# Patient Record
Sex: Female | Born: 1975 | Race: White | Hispanic: No | Marital: Married | Smoking: Never smoker
Health system: Southern US, Community
[De-identification: ages and names within clinical notes are randomized; demographics above are authoritative.]

---

## 1999-02-10 ENCOUNTER — Other Ambulatory Visit: Admission: RE | Admit: 1999-02-10 | Discharge: 1999-02-10 | Payer: Self-pay | Admitting: Obstetrics and Gynecology

## 2000-12-15 ENCOUNTER — Emergency Department (HOSPITAL_COMMUNITY): Admission: EM | Admit: 2000-12-15 | Discharge: 2000-12-15 | Payer: Self-pay | Admitting: Emergency Medicine

## 2000-12-18 ENCOUNTER — Ambulatory Visit (HOSPITAL_BASED_OUTPATIENT_CLINIC_OR_DEPARTMENT_OTHER): Admission: RE | Admit: 2000-12-18 | Discharge: 2000-12-18 | Payer: Self-pay | Admitting: *Deleted

## 2002-08-13 ENCOUNTER — Other Ambulatory Visit: Admission: RE | Admit: 2002-08-13 | Discharge: 2002-08-13 | Payer: Self-pay | Admitting: Obstetrics and Gynecology

## 2003-08-21 ENCOUNTER — Other Ambulatory Visit: Admission: RE | Admit: 2003-08-21 | Discharge: 2003-08-21 | Payer: Self-pay | Admitting: Obstetrics and Gynecology

## 2004-03-04 ENCOUNTER — Ambulatory Visit (HOSPITAL_COMMUNITY): Admission: RE | Admit: 2004-03-04 | Discharge: 2004-03-04 | Payer: Self-pay | Admitting: Obstetrics and Gynecology

## 2004-03-04 ENCOUNTER — Encounter (INDEPENDENT_AMBULATORY_CARE_PROVIDER_SITE_OTHER): Payer: Self-pay | Admitting: *Deleted

## 2005-02-19 ENCOUNTER — Inpatient Hospital Stay (HOSPITAL_COMMUNITY): Admission: AD | Admit: 2005-02-19 | Discharge: 2005-02-21 | Payer: Self-pay | Admitting: Obstetrics and Gynecology

## 2006-07-26 ENCOUNTER — Inpatient Hospital Stay (HOSPITAL_COMMUNITY): Admission: AD | Admit: 2006-07-26 | Discharge: 2006-07-26 | Payer: Self-pay | Admitting: Obstetrics and Gynecology

## 2006-09-06 ENCOUNTER — Inpatient Hospital Stay (HOSPITAL_COMMUNITY): Admission: AD | Admit: 2006-09-06 | Discharge: 2006-09-08 | Payer: Self-pay | Admitting: *Deleted

## 2008-12-04 ENCOUNTER — Inpatient Hospital Stay (HOSPITAL_COMMUNITY): Admission: AD | Admit: 2008-12-04 | Discharge: 2008-12-06 | Payer: Self-pay | Admitting: Obstetrics and Gynecology

## 2010-11-09 LAB — CBC
HCT: 34.9 % — ABNORMAL LOW (ref 36.0–46.0)
Hemoglobin: 12.1 g/dL (ref 12.0–15.0)
MCHC: 34.7 g/dL (ref 30.0–36.0)
MCHC: 34.8 g/dL (ref 30.0–36.0)
MCV: 99.1 fL (ref 78.0–100.0)
Platelets: 102 10*3/uL — ABNORMAL LOW (ref 150–400)
Platelets: 93 10*3/uL — ABNORMAL LOW (ref 150–400)
RDW: 14.7 % (ref 11.5–15.5)
WBC: 8 10*3/uL (ref 4.0–10.5)

## 2010-12-14 NOTE — H&P (Signed)
NAMEJOVAN, SCHICKLING             ACCOUNT NO.:  [id number]   MEDICAL RECORD NO.:  [id number]          PATIENT TYPE:  INP   LOCATION:  9166                          FACILITY:  [facility]   PHYSICIAN:  Lenoard Aden, M.D.DATE OF BIRTH:  Jul 26, 1976   DATE OF ADMISSION:  12/04/2008  DATE OF DISCHARGE:                              HISTORY & PHYSICAL   CHIEF COMPLAINT:  Labor.   HISTORY:  She is a 35 year old white female G3, P2 at 78 plus weeks'  gestation with a history of probable large for gestational age fetus,  who presents in active labor with contractions every 3-4 minutes.   ALLERGIES:  She has no known drug allergies.   MEDICATIONS:  Prenatal vitamins.   PAST HISTORY:  She has a previous history of being GBS positive,  currently negative at this pregnancy.   SOCIAL HISTORY:  She is a nonsmoker, nondrinker.  Denies domestic  physical violence.   FAMILY HISTORY:  Kidney disease, skin and breast cancer, thyroid  disease, and rheumatoid arthritis.   PREVIOUS PREGNANCY HISTORY:  Remarkable for 2 miscarriages, 1 with D and  C and 1 uncomplicated vaginal delivery of a 9 pounds 9 ounces fetus and  other uncomplicated vaginal delivery of a 8 pounds 15 ounces fetus.   PRENATAL COURSE:  Otherwise to date uncomplicated except for large for  gestational age fetus.   PHYSICAL EXAMINATION:  GENERAL:  She is a well-developed and well-  nourished white female in mild amount of distress.  HEENT:  Normal.  LUNGS:  Clear.  HEART:  Regular rhythm.  ABDOMEN:  Soft, gravid, and nontender.  Estimated fetal weight is 9  pounds.  Cervix is 4, 100% vertex, 0 station.  EXTREMITIES:  No cords.  NEUROLOGIC:  Nonfocal.  SKIN:  Intact.   IMPRESSION:  A 39-week intrauterine pregnancy in active labor.   PLAN:  Epidural Pitocin as needed.  Anticipate attempts at vaginal  delivery.      Lenoard Aden, M.D.  Electronically Signed     RJT/MEDQ  D:  12/04/2008  T:  12/04/2008  Job:   [id number]

## 2010-12-17 NOTE — Op Note (Signed)
[facility]  Patient:    Tamara Li, Tamara Li                        MRN: [id number] Proc. Date: 12/18/00 Adm. Date:  [id number] Attending:  Kendell Bane                           Operative Report  PREOPERATIVE DIAGNOSIS:  Partial flexor tendon laceration and radial digital nerve laceration, right index finger.  POSTOPERATIVE DIAGNOSIS:  Partial flexor tendon laceration and radial digital nerve laceration, right index finger.  PROCEDURE:  Repair of flexor sheath and radial digital nerve, right index finger.  SURGEON:  Lowell Bouton, M.D.  ANESTHESIA:  General.  OPERATIVE FINDINGS:  The patient had a 30% laceration of the profundus tendon beneath the A4 pulley on the right index finger.  There was a transverse laceration in the A4 pulley and the partial tendon laceration was triggering on the pulley.  The radial digital nerve and artery were lacerated at the same level as the tendon.  DESCRIPTION OF PROCEDURE:  Under general anesthesia with a tourniquet on the right arm, the right hand was prepped and draped in the usual fashion.  After elevating the limb, the tourniquet was inflated to 250 mmHg.  The previous sutures were removed and the laceration was extended proximally in a zigzag fashion.  Blunt dissection was carried down to the flexor sheath and the opening in the A4 pulley was examined.  The profundus tendon was seen beneath the pulley and was found to be approximately 30% transected.  It was felt that repairing the pulley would allow for good gliding of the tendon without a need to debride it or repair it.  A 6-0 Prolene was used to repair the A4 pulley and there was no triggering of the tendon beneath it.  The microscope was then brought in and the ends of the digital nerve were identified and trimmed back with the scissors.  A 9-0 nylon suture was used to repair the radial digital nerve using an epineural  repair.  The wound was then irrigated copiously with saline and the skin was closed with 4-0 nylon sutures.  Sterile dressings were applied followed by a dorsal protective splint.  The tourniquet was released with good circulation of the digits.  The patient went to the recovery room awake, stable, and in good condition. DD:  12/18/00 TD:  12/19/00 Job: [id number]

## 2010-12-17 NOTE — Op Note (Signed)
Tamara Li, Tamara Li                         ACCOUNT NO.:  [id number]   MEDICAL RECORD NO.:  [id number]                   PATIENT TYPE:  AMB   LOCATION:  SDC                                  [facility]ACILITY:  [facility]   PHYSICIAN:  Richardean Sale, M.D.                DATE OF BIRTH:  [birth year]   DATE OF PROCEDURE:  03/04/2004  DATE OF DISCHARGE:                                 OPERATIVE REPORT   PREOPERATIVE DIAGNOSIS:  Missed abortion.   POSTOPERATIVE DIAGNOSIS:  Missed abortion.   OPERATION PERFORMED:  Dilation and curettage with suction.   SURGEON:  Richardean Sale, M.D.   ANESTHESIA:  Conscious sedation with paracervical block.   ESTIMATED BLOOD LOSS:  Minimal.   COMPLICATIONS:  None.   FINDINGS:  Small retroverted uterus, nontender, no palpable masses.  Adnexa  normal.  Moderate amount of products of conception.   SPECIMENS:  Products of conception.   INDICATIONS FOR PROCEDURE:  The patient is a 35 year old gravida 1, para 0  white female who presented for obstetric care in the first trimester.  Once  week prior to the day of procedure, she was found to have an intrauterine  pregnancy with a bradycardic rhythm.  Follow-up ultrasound one week later  revealed an intrauterine fetal pole with an absent cardiac rhythm consistent  with missed abortion.  The patient presents today for surgical management  with  D&C with suction.  Prior to the procedure, the risks were reviewed  with the patient and her husband.  Specifically, we discussed the risks of  hemorrhage requiring transfusion, uterine perforation which could result in  injury to the bowel or bladder or other organs which would require  additional surgery, entry through the abdomen either by laparoscopy or  laparotomy with repair of any injury, intrauterine scarring which could  cause difficulty getting pregnant in the future, postoperative infection  which could require hospitalization with antibiotics, possibility of  retained products which would require repeat procedure in the future,  anesthetic-related complications.  The patient was understanding of all  these risks and agreed to proceed.  Informed consent was obtained and all  questions were answered prior to proceeding to the operating room.   DESCRIPTION OF PROCEDURE:  The patient was taken to the operating room where  she was given intravenous sedation.  She was then prepped and draped in the  usual sterile fashion and placed in the dorsal lithotomy position. Her  bladder had been emptied just prior to entering the operating room.  Bimanual exam was performed which confirmed the presence of a small  retroverted uterus, approximately 8 weeks size, no adnexal masses were  noted.  A speculum was then placed in the vagina.  The cervix was easily  visualized and was injected at the 12 o'clock position with 2 mL of 1%  Nesacaine. A single toothed tenaculum was used to grasp the anterior lip of  the cervix.  A paracervical block was then administered using a total of 20  mL of 1% Nesacaine.  The uterus was then sounded to 9 cm.  The uterus was  then very carefully dilated with Hegar dilators.  A #8 suction curet was  introduced and suction was applied.  Moderate amounts of products of  conception were removed.  Suction was then discontinued.  This was followed  by a sharp curettage.  Suction was then applied one more time to remove any  loose tissue in the uterine cavity.  Curettage was repeated until a gritty  texture was noted on all four sides of the uterine cavity.  The specimen was  then sent to pathology labeled products of conception.  Once curettage and  suction were complete, the single toothed tenaculum was removed.  The  tenaculum site was then cauterized with silver nitrate.  There was minimal  bleeding noted from the cervix.  The speculum was then removed.  Bimanual  exam confirmed the presence of a small retroverted uterus and no  palpable  masses.  There were no complications, no evidence of uterine perforation.  All sponge, lap, needle and instrument counts were correct times two.  The  patient was taken out of dorsal lithotomy position.  Her sedation was  reversed and she was taken to recovery room awake and in stable condition.                                               Richardean Sale, M.D.    JW/MEDQ  D:  03/04/2004  T:  03/05/2004  Job:  [id number]

## 2014-07-31 ENCOUNTER — Other Ambulatory Visit: Payer: Self-pay | Admitting: Dermatology

## 2015-01-13 ENCOUNTER — Other Ambulatory Visit: Payer: Self-pay | Admitting: Orthopedic Surgery

## 2015-01-13 DIAGNOSIS — R52 Pain, unspecified: Secondary | ICD-10-CM

## 2015-01-15 ENCOUNTER — Ambulatory Visit
Admission: RE | Admit: 2015-01-15 | Discharge: 2015-01-15 | Disposition: A | Discharge status: Home / Self Care | Payer: 59 | Source: Ambulatory Visit | Attending: Orthopedic Surgery | Admitting: Orthopedic Surgery

## 2015-01-15 DIAGNOSIS — R52 Pain, unspecified: Secondary | ICD-10-CM

## 2015-03-22 ENCOUNTER — Ambulatory Visit (INDEPENDENT_AMBULATORY_CARE_PROVIDER_SITE_OTHER): Payer: 59

## 2015-03-22 ENCOUNTER — Ambulatory Visit (INDEPENDENT_AMBULATORY_CARE_PROVIDER_SITE_OTHER): Payer: 59 | Admitting: Internal Medicine

## 2015-03-22 VITALS — BP 100/66 | HR 93 | Temp 98.3°F | Resp 18 | Ht 65.0 in | Wt 126.0 lb

## 2015-03-22 DIAGNOSIS — B9689 Other specified bacterial agents as the cause of diseases classified elsewhere: Secondary | ICD-10-CM

## 2015-03-22 DIAGNOSIS — R05 Cough: Secondary | ICD-10-CM

## 2015-03-22 DIAGNOSIS — R059 Cough, unspecified: Secondary | ICD-10-CM

## 2015-03-22 DIAGNOSIS — J019 Acute sinusitis, unspecified: Secondary | ICD-10-CM | POA: Diagnosis not present

## 2015-03-22 LAB — POCT CBC
GRANULOCYTE PERCENT: 76.6 % (ref 37–80)
HEMATOCRIT: 41 % (ref 37.7–47.9)
Hemoglobin: 13.3 g/dL (ref 12.2–16.2)
LYMPH, POC: 1.7 (ref 0.6–3.4)
MCH, POC: 30.8 pg (ref 27–31.2)
MCHC: 32.5 g/dL (ref 31.8–35.4)
MCV: 94.6 fL (ref 80–97)
MID (CBC): 1.1 — AB (ref 0–0.9)
MPV: 8.2 fL (ref 0–99.8)
PLATELET COUNT, POC: 257 10*3/uL (ref 142–424)
POC Granulocyte: 9.1 — AB (ref 2–6.9)
POC LYMPH %: 14 % (ref 10–50)
POC MID %: 9.4 %M (ref 0–12)
RBC: 4.33 M/uL (ref 4.04–5.48)
RDW, POC: 13.9 %
WBC: 11.9 10*3/uL — AB (ref 4.6–10.2)

## 2015-03-22 MED ORDER — PREDNISONE 20 MG PO TABS: 40.0000 mg | ORAL_TABLET | Freq: Every day | ORAL | Status: DC

## 2015-03-22 MED ORDER — AMOXICILLIN-POT CLAVULANATE 875-125 MG PO TABS: 1.0000 | ORAL_TABLET | Freq: Two times a day (BID) | ORAL | Status: DC

## 2015-03-22 NOTE — Progress Notes (Signed)
03/22/2015 at 4:53 PM  Tamara Li / DOB: 01/07/1976 / MRN: [id number]  The patient  does not have a problem list on file.  SUBJECTIVE  Tamara Li is a 39 y.o. female complaining of dry cough, nasal blockage, post nasal drip and sinus and nasal congestion that started 4 weeks ago.  Associated symptoms include headache today, and she denies difficulty breathing and jaw pain.The patient symptoms are worsening. Treatments tried thus far include acetaminophen, ibuprofen, antihistamine-decongestant of choice with fair  relief. She reports sick contacts.  She  has no past medical history on file.    Medications reviewed and updated by myself where necessary, and exist elsewhere in the encounter.   Ms. Credeur has No Known Allergies. She  reports that she has never smoked. She does not have any smokeless tobacco history on file. She reports that she drinks about 0.6 - 1.2 oz of alcohol per week. She reports that she does not use illicit drugs. She  has no sexual activity history on file. The patient  has no past surgical history on file.  Her family history includes Hyperlipidemia in her father.  Review of Systems  Constitutional: Negative for fever and chills.  Respiratory: Negative for shortness of breath.   Cardiovascular: Negative for chest pain.  Gastrointestinal: Negative for nausea and abdominal pain.  Genitourinary: Negative.   Skin: Negative for rash.  Neurological: Negative for dizziness and headaches.    OBJECTIVE  Her  height is 5\' 5"  (1.651 m) and weight is 126 lb (57.153 kg). Her oral temperature is 98.3 F (36.8 C). Her blood pressure is 100/66 and her pulse is 93. Her respiration is 18 and oxygen saturation is 98%.  The patient's body mass index is 20.97 kg/(m^2).  Physical Exam  Constitutional: She is oriented to person, place, and time. She appears well-developed and well-nourished. No distress.  HENT:  Right Ear: Hearing, tympanic membrane, external ear and  ear canal normal.  Left Ear: Hearing, tympanic membrane, external ear and ear canal normal.  Nose: Mucosal edema present. Right sinus exhibits no maxillary sinus tenderness and no frontal sinus tenderness. Left sinus exhibits no maxillary sinus tenderness and no frontal sinus tenderness.  Mouth/Throat: Uvula is midline, oropharynx is clear and moist and mucous membranes are normal.  Cardiovascular: Normal rate, regular rhythm and normal heart sounds.   Respiratory: Effort normal and breath sounds normal. She has no wheezes. She has no rales.  Neurological: She is alert and oriented to person, place, and time.  Skin: Skin is warm and dry. She is not diaphoretic.  Psychiatric: She has a normal mood and affect.    Results for orders placed or performed in visit on 03/22/15 (from the past 24 hour(s))  POCT CBC     Status: Abnormal   Collection Time: 03/22/15  3:18 PM  Result Value Ref Range   WBC 11.9 (A) 4.6 - 10.2 K/uL   Lymph, poc 1.7 0.6 - 3.4   POC LYMPH PERCENT 14.0 10 - 50 %L   MID (cbc) 1.1 (A) 0 - 0.9   POC MID % 9.4 0 - 12 %M   POC Granulocyte 9.1 (A) 2 - 6.9   Granulocyte percent 76.6 37 - 80 %G   RBC 4.33 4.04 - 5.48 M/uL   Hemoglobin 13.3 12.2 - 16.2 g/dL   HCT, POC 40.9 81.1 - 47.9 %   MCV 94.6 80 - 97 fL   MCH, POC 30.8 27 - 31.2 pg  MCHC 32.5 31.8 - 35.4 g/dL   RDW, POC 16.1 %   Platelet Count, POC 257 142 - 424 K/uL   MPV 8.2 0 - 99.8 fL   [facility] reading (PRIMARY) by  Dr. Merla Riches: Lungs positive for hyperexpansion. Negative for acute cardiopulmonary disease.   ASSESSMENT & PLAN  Tamara Li was seen today for cough and sinus pressure.  Diagnoses and all orders for this visit:  Cough -     POCT CBC -     DG Chest 2 View; Future -     predniSONE (DELTASONE) 20 MG tablet; Take 2 tablets (40 mg total) by mouth daily with breakfast.  Acute bacterial rhinosinusitis: Patient with double sickening. Will cover for bacterial infection.  -     amoxicillin-clavulanate  (AUGMENTIN) 875-125 MG per tablet; Take 1 tablet by mouth 2 (two) times daily.    The patient was advised to call or come back to clinic if she does not see an improvement in symptoms, or worsens with the above plan.   Deliah Boston, MHS, PA-C [facility] 03/22/2015 4:53 PM I have participated in the care of this patient with the Advanced Practice Provider and agree with Diagnosis and Plan as documented. Robert P. Merla Riches, M.D.

## 2016-01-06 ENCOUNTER — Other Ambulatory Visit: Payer: Self-pay | Admitting: Orthopedic Surgery

## 2016-01-06 DIAGNOSIS — M545 Low back pain: Secondary | ICD-10-CM

## 2016-01-13 ENCOUNTER — Ambulatory Visit
Admission: RE | Admit: 2016-01-13 | Discharge: 2016-01-13 | Disposition: A | Discharge status: Home / Self Care | Payer: 59 | Source: Ambulatory Visit | Attending: Orthopedic Surgery | Admitting: Orthopedic Surgery

## 2016-01-13 DIAGNOSIS — M545 Low back pain: Secondary | ICD-10-CM

## 2016-04-08 ENCOUNTER — Encounter: Payer: Self-pay | Admitting: Physician Assistant

## 2016-04-08 ENCOUNTER — Ambulatory Visit (INDEPENDENT_AMBULATORY_CARE_PROVIDER_SITE_OTHER): Payer: 59 | Admitting: Physician Assistant

## 2016-04-08 VITALS — BP 114/72 | HR 98 | Temp 98.6°F | Resp 16 | Ht 65.0 in | Wt 129.0 lb

## 2016-04-08 DIAGNOSIS — J069 Acute upper respiratory infection, unspecified: Secondary | ICD-10-CM

## 2016-04-08 DIAGNOSIS — R070 Pain in throat: Secondary | ICD-10-CM

## 2016-04-08 DIAGNOSIS — B9789 Other viral agents as the cause of diseases classified elsewhere: Principal | ICD-10-CM

## 2016-04-08 LAB — POCT RAPID STREP A (OFFICE): Rapid Strep A Screen: NEGATIVE

## 2016-04-08 MED ORDER — BENZONATATE 100 MG PO CAPS: 100.0000 mg | ORAL_CAPSULE | Freq: Three times a day (TID) | ORAL | 0 refills | Status: AC | PRN

## 2016-04-08 MED ORDER — HYDROCOD POLST-CPM POLST ER 10-8 MG/5ML PO SUER: 5.0000 mL | Freq: Every evening | ORAL | 0 refills | Status: AC | PRN

## 2016-04-08 MED ORDER — IPRATROPIUM BROMIDE 0.03 % NA SOLN: 2.0000 | Freq: Two times a day (BID) | NASAL | 0 refills | Status: AC

## 2016-04-08 MED ORDER — GUAIFENESIN ER 1200 MG PO TB12
1.0000 | ORAL_TABLET | Freq: Two times a day (BID) | ORAL | 1 refills | Status: AC | PRN
Start: 2016-04-08 — End: ?

## 2016-04-08 NOTE — Progress Notes (Signed)
Patient ID: Tamara Li, female   DOB: 1976/07/17, 40 y.o.   MRN: [id number] [facility] [address], [place] [address] [phone]  By signing my name below, I, Essence Howell, attest that this documentation has been prepared under the direction and in the presence of Trena PlattStephanie English, PA-C Electronically Signed: Charline BillsEssence Howell, ED Scribe 04/08/2016 at 3:04 PM.  Date:  04/08/2016   Name:  Tamara Li   DOB:  1976/07/17   MRN:  [id number]  PCP:  Martha ClanShaw, William, MD   History of Present Illness:  Tamara Li is a 40 y.o. female patient [facility] of nasal congestion for the past 10 days. Pt reports associated symptoms of faint dry cough, sore throat x 3 days that is exacerbated with swallowing, scratchy throat, watery eyes, clear rhinorrhea, left ear pain, fatigue. She denies fever, sneezing, sob, trouble breathing nausea. Pt states that her children are sick with similar symptoms. She has tried TheraFlu and OTC cough and cold medications without relief and keeps her up at night.   There are no active problems to display for this patient.   History reviewed. No pertinent past medical history.  History reviewed. No pertinent surgical history.  Social History  Substance Use Topics   Smoking status: Never Smoker   Smokeless tobacco: Never Used   Alcohol use 0.6 - 1.2 oz/week    1 - 2 Standard drinks or equivalent per week    Family History  Problem Relation Age of Onset   Hyperlipidemia Father    Stroke Maternal Grandfather     No Known Allergies  Medication list has been reviewed and updated.  No current outpatient prescriptions on file prior to visit.   No current facility-administered medications on file prior to visit.     Review of Systems  Constitutional: Positive for malaise/fatigue. Negative for fever.  HENT: Positive for congestion, ear pain and sore throat.   Respiratory: Positive for cough.  Negative for shortness of breath.   Gastrointestinal: Negative for nausea.    Physical Examination: BP 114/72    Pulse 98    Temp 98.6 F (37 C) (Oral)    Resp 16    Ht 5\' 5"  (1.651 m)    Wt 129 lb (58.5 kg)    SpO2 98%    BMI 21.47 kg/m  Ideal Body Weight: @FLOWAMB ([phone])@[phone])@  Physical Exam  Constitutional: She is oriented to person, place, and time. She appears well-developed and well-nourished. No distress.  HENT:  Head: Normocephalic and atraumatic.  Right Ear: Tympanic membrane, external ear and ear canal normal.  Left Ear: Tympanic membrane, external ear and ear canal normal.  Nose: Mucosal edema and rhinorrhea present. Right sinus exhibits no maxillary sinus tenderness and no frontal sinus tenderness. Left sinus exhibits no maxillary sinus tenderness and no frontal sinus tenderness.  Mouth/Throat: No uvula swelling. No oropharyngeal exudate, posterior oropharyngeal edema or posterior oropharyngeal erythema.  No tonsillar edema but there is redness  Eyes: Conjunctivae and EOM are normal. Pupils are equal, round, and reactive to light.  Cardiovascular: Normal rate and regular rhythm.  Exam reveals no gallop, no distant heart sounds and no friction rub.   No murmur heard. Pulmonary/Chest: Effort normal. No respiratory distress. She has no decreased breath sounds. She has no wheezes. She has no rhonchi.  Lymphadenopathy:       Head (right side): No submandibular, no tonsillar, no preauricular and no posterior auricular adenopathy present.  Head (left side): No submandibular, no tonsillar, no preauricular and no posterior auricular adenopathy present.    She has cervical adenopathy (L anterior).  Neurological: She is alert and oriented to person, place, and time.  Skin: She is not diaphoretic.  Psychiatric: She has a normal mood and affect. Her behavior is normal.    Assessment and Plan: [name] is a 40 y.o. female who is here today for throat pain.   Treat with z  pak if not improving within 2 weeks.  Viral URI with cough - Plan: chlorpheniramine-HYDROcodone (TUSSIONEX PENNKINETIC ER) 10-8 MG/5ML SUER, Guaifenesin (MUCINEX MAXIMUM STRENGTH) 1200 MG TB12, benzonatate (TESSALON) 100 MG capsule, ipratropium (ATROVENT) 0.03 % nasal spray  Throat pain - Plan: POCT rapid strep A, Culture, Group A Strep  Trena Platt, PA-C [facility] 9/18/20175:52 PM

## 2016-04-08 NOTE — Patient Instructions (Addendum)
IF you received an x-ray today, you will receive an invoice from [facility]. Please contact [facility] at [phone] with questions or concerns regarding your invoice.   IF you received labwork today, you will receive an invoice from [employer]. Please contact [facility] at [phone] with questions or concerns regarding your invoice.   Our billing staff will not be able to assist you with questions regarding bills from these companies.  You will be contacted with the lab results as soon as they are available. The fastest way to get your results is to activate your My Chart account. Instructions are located on the last page of this paperwork. If you have not heard from [place] regarding the results in 2 weeks, please contact this office.   Please take the medication as prescribed.  Upper Respiratory Infection, Adult Most upper respiratory infections (URIs) are a viral infection of the air passages leading to the lungs. A URI affects the nose, throat, and upper air passages. The most common type of URI is nasopharyngitis and is typically referred to as "the common cold." URIs run their course and usually go away on their own. Most of the time, a URI does not require medical attention, but sometimes a bacterial infection in the upper airways can follow a viral infection. This is called a secondary infection. Sinus and middle ear infections are common types of secondary upper respiratory infections. Bacterial pneumonia can also complicate a URI. A URI can worsen asthma and chronic obstructive pulmonary disease (COPD). Sometimes, these complications can require emergency medical care and may be life threatening.  CAUSES Almost all URIs are caused by viruses. A virus is a type of germ and can spread from one person to another.  RISKS FACTORS You may be at risk for a URI if:   You smoke.   You have chronic heart or lung disease.  You have a  weakened defense (immune) system.   You are very young or very old.   You have nasal allergies or asthma.  You work in crowded or poorly ventilated areas.  You work in health care facilities or schools. SIGNS AND SYMPTOMS  Symptoms typically develop 2-3 days after you come in contact with a cold virus. Most viral URIs last 7-10 days. However, viral URIs from the influenza virus (flu virus) can last 14-18 days and are typically more severe. Symptoms may include:   Runny or stuffy (congested) nose.   Sneezing.   Cough.   Sore throat.   Headache.   Fatigue.   Fever.   Loss of appetite.   Pain in your forehead, behind your eyes, and over your cheekbones (sinus pain).  Muscle aches.  DIAGNOSIS  Your health care provider may diagnose a URI by:  Physical exam.  Tests to check that your symptoms are not due to another condition such as:  Strep throat.  Sinusitis.  Pneumonia.  Asthma. TREATMENT  A URI goes away on its own with time. It cannot be cured with medicines, but medicines may be prescribed or recommended to relieve symptoms. Medicines may help:  Reduce your fever.  Reduce your cough.  Relieve nasal congestion. HOME CARE INSTRUCTIONS   Take medicines only as directed by your health care provider.   Gargle warm saltwater or take cough drops to comfort your throat as directed by your health care provider.  Use a warm mist humidifier or inhale steam from a shower to increase air moisture. This may make it  easier to breathe.  Drink enough fluid to keep your urine clear or pale yellow.   Eat soups and other clear broths and maintain good nutrition.   Rest as needed.   Return to work when your temperature has returned to normal or as your health care provider advises. You may need to stay home longer to avoid infecting others. You can also use a face mask and careful hand washing to prevent spread of the virus.  Increase the usage of your  inhaler if you have asthma.   Do not use any tobacco products, including cigarettes, chewing tobacco, or electronic cigarettes. If you need help quitting, ask your health care provider. PREVENTION  The best way to protect yourself from getting a cold is to practice good hygiene.   Avoid oral or hand contact with people with cold symptoms.   Wash your hands often if contact occurs.  There is no clear evidence that vitamin C, vitamin E, echinacea, or exercise reduces the chance of developing a cold. However, it is always recommended to get plenty of rest, exercise, and practice good nutrition.  SEEK MEDICAL CARE IF:   You are getting worse rather than better.   Your symptoms are not controlled by medicine.   You have chills.  You have worsening shortness of breath.  You have brown or red mucus.  You have yellow or brown nasal discharge.  You have pain in your face, especially when you bend forward.  You have a fever.  You have swollen neck glands.  You have pain while swallowing.  You have white areas in the back of your throat. SEEK IMMEDIATE MEDICAL CARE IF:   You have severe or persistent:  Headache.  Ear pain.  Sinus pain.  Chest pain.  You have chronic lung disease and any of the following:  Wheezing.  Prolonged cough.  Coughing up blood.  A change in your usual mucus.  You have a stiff neck.  You have changes in your:  Vision.  Hearing.  Thinking.  Mood. MAKE SURE YOU:   Understand these instructions.  Will watch your condition.  Will get help right away if you are not doing well or get worse.   This information is not intended to replace advice given to you by your health care provider. Make sure you discuss any questions you have with your health care provider.   Document Released: 01/11/2001 Document Revised: 12/02/2014 Document Reviewed: 10/23/2013 Elsevier Interactive Patient Education [employer]2016 [employer]

## 2016-04-10 LAB — CULTURE, GROUP A STREP: ORGANISM ID, BACTERIA: NORMAL

## 2016-06-16 IMAGING — MR MR LUMBAR SPINE W/O CM
4 of 5 series · 19 of 48 positions shown · non-contrast
Comparison: None.

CLINICAL DATA: Pain and numbness and right hip for 6 weeks
extending down the right leg.

EXAM:
MRI LUMBAR SPINE WITHOUT CONTRAST
TECHNIQUE: Multiplanar, multisequence MR imaging of the lumbar spine was
performed. No intravenous contrast was administered.

[Series 6: T2 · sagittal · 4.0mm · 0.73mm/px · 7 of 14 slices shown (1 of 2)]
[im 1/14]
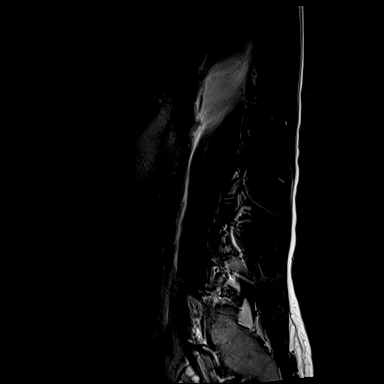
[im 3/14]
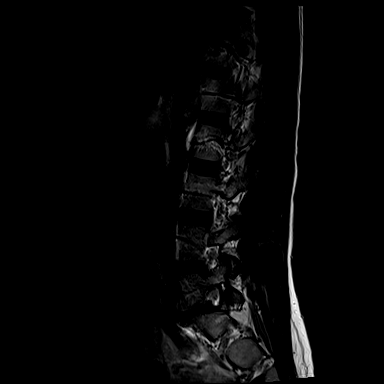
[im 5/14]
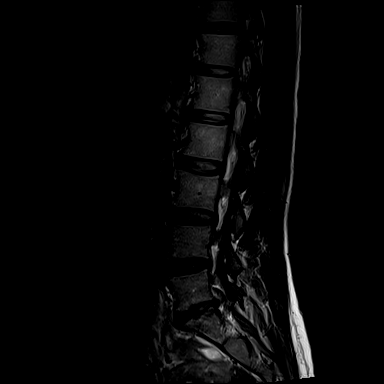
[im 7/14]
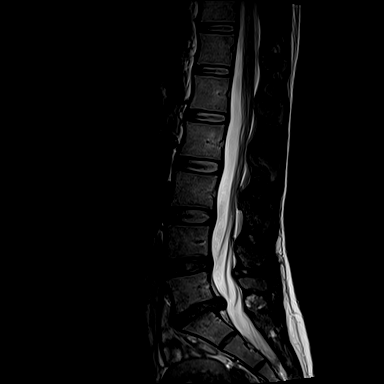
[im 9/14]
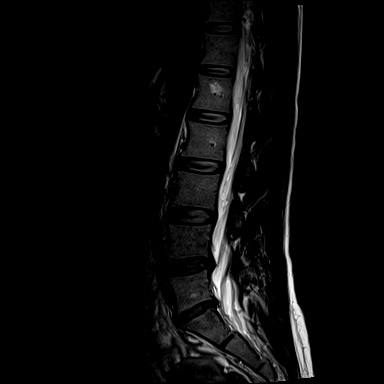
[im 11/14]
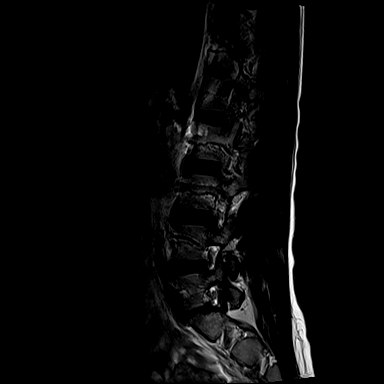
[im 14/14]
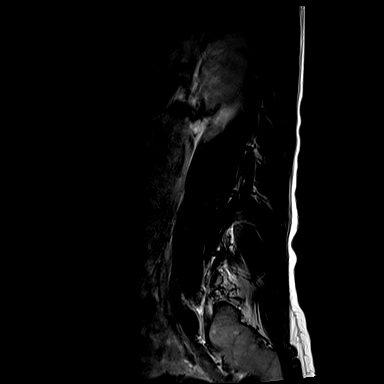

[Series 7: T1 · sagittal · 4.0mm · 0.73mm/px · 3 of 14 slices shown (1 of 2)]
[im 3/14]
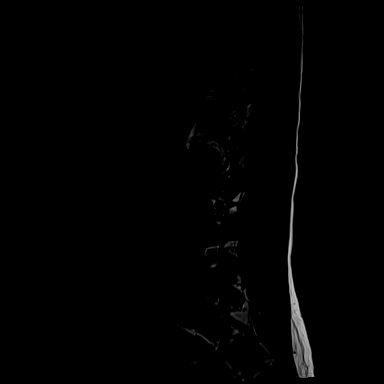
[im 7/14]
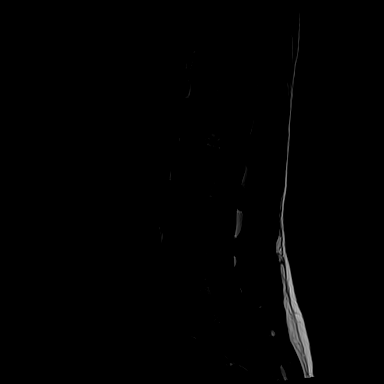
[im 11/14]
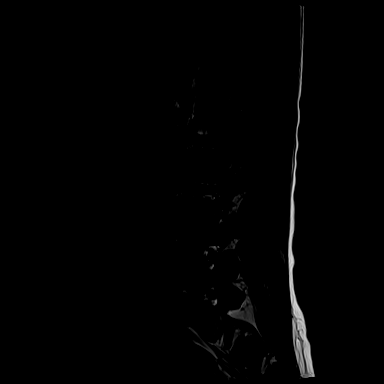

[Series 9: T1 · axial · 4.0mm · 0.28mm/px · z∈[-82,-8]mm · 3 of 21 slices shown (2 of 2)]
[im 3/21]
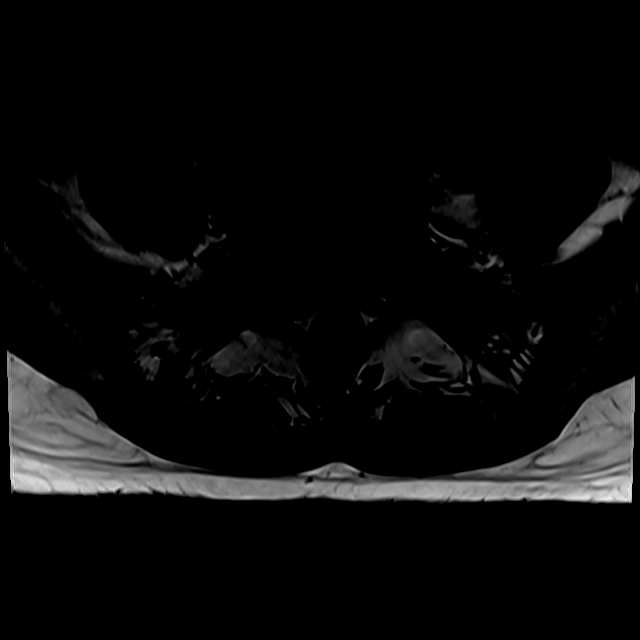
[im 12/21]
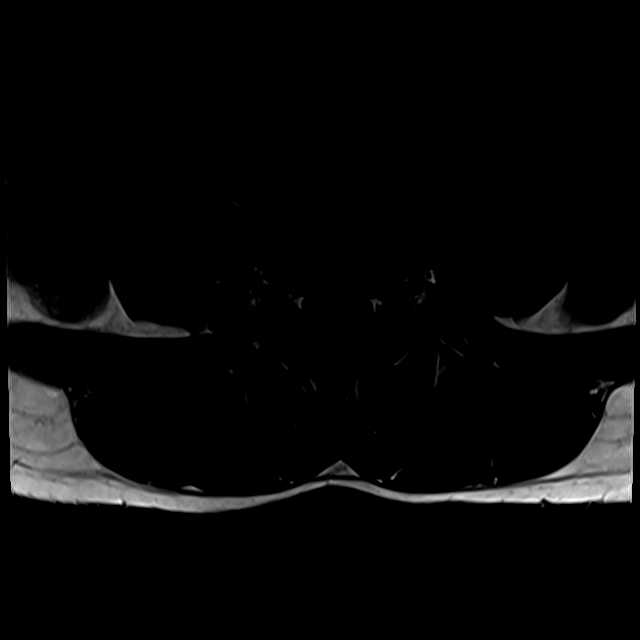
[im 18/21]
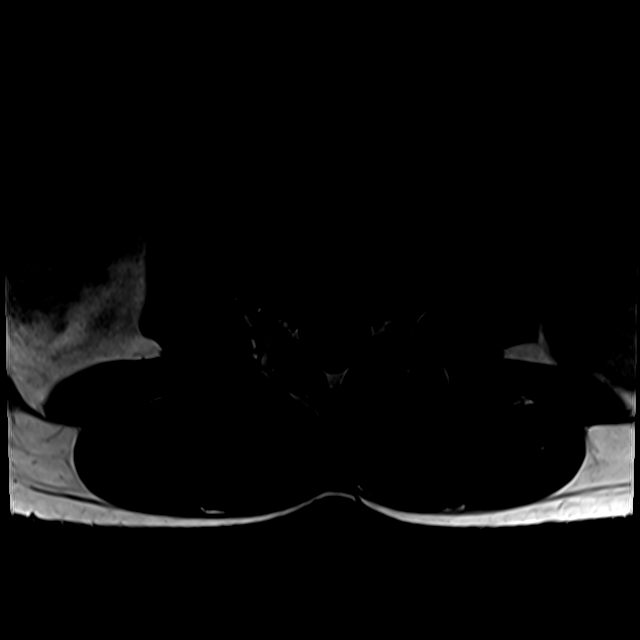

[Series 13: T2 · axial · 4.0mm · 0.28mm/px · z∈[-82,+72]mm · 6 of 34 slices shown (2 of 2)]
[im 3/34]
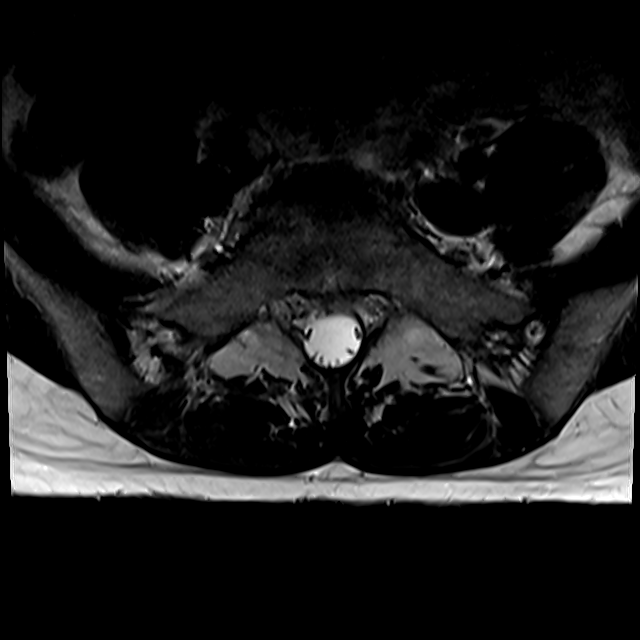
[im 5/34]
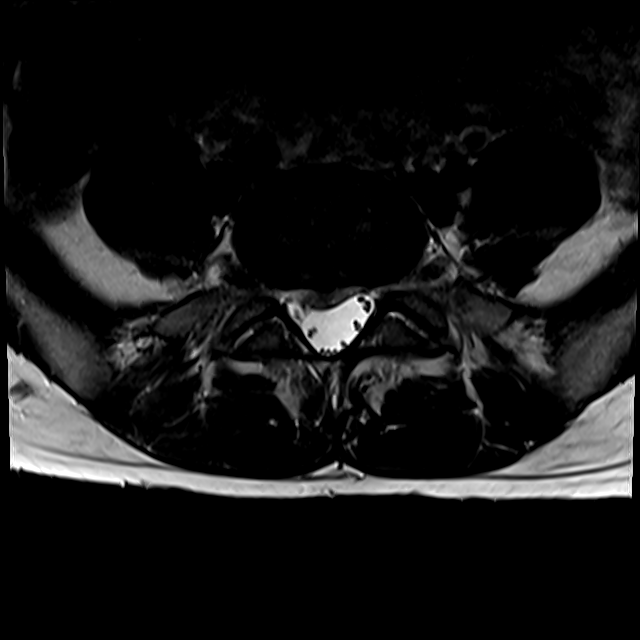
[im 7/34]
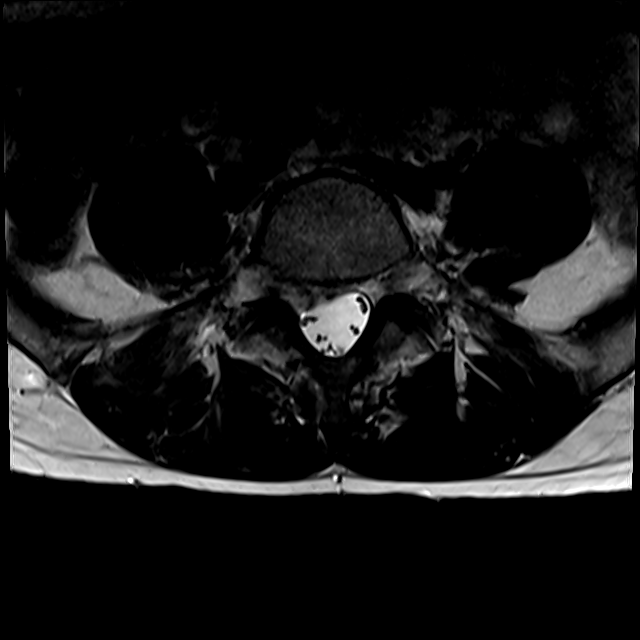
[im 11/34]
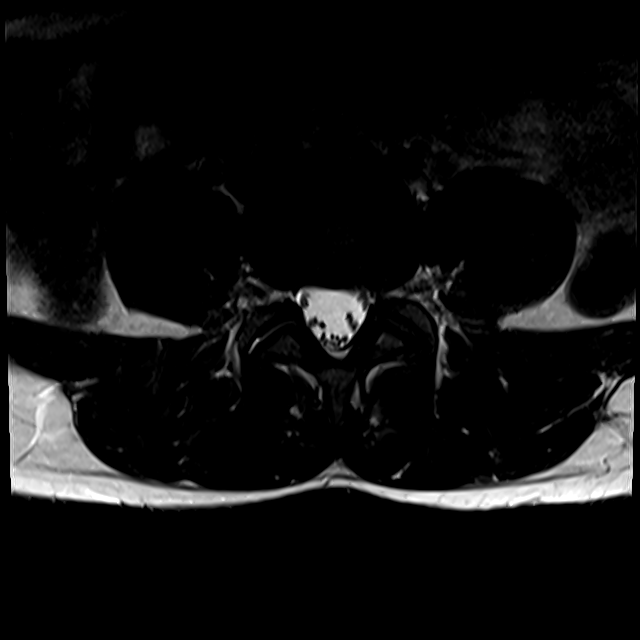
[im 17/34]
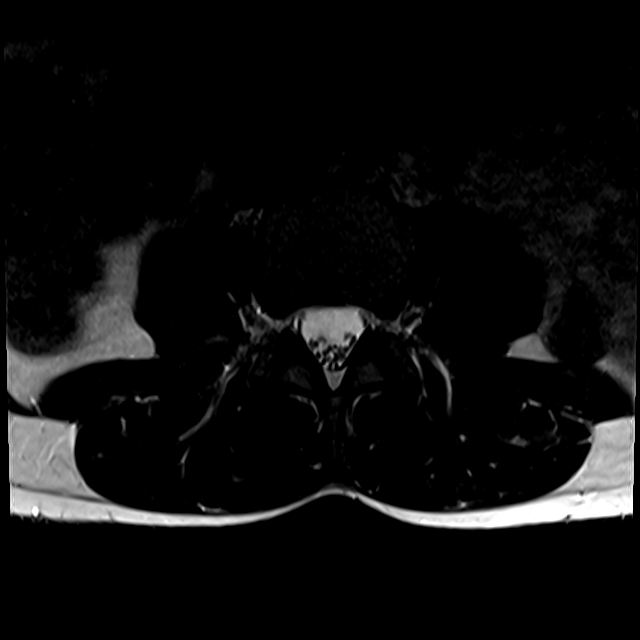
[im 29/34]
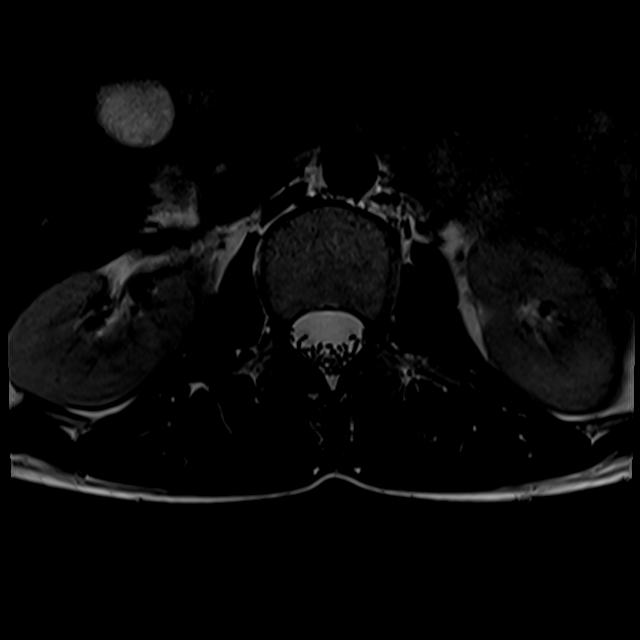

[19 of 48 positions shown; findings below may reference images not displayed]

FINDINGS: Segmentation:  Normal.  No congenital anomalies.

Alignment:  Normal.

Vertebrae:  Normal.

Conus medullaris: Extends to the L1-2 level and appears normal.

Paraspinal and other soft tissues: Normal.

Disc levels:

T11-12 through L3-4:  Normal.

L4-5: Slight disc desiccation. Small focal disc a protrusion at the
level of the origin of the left neural foramen slightly indenting
the thecal sac without neural impingement.

L5-S1: Prominent soft disc extrusion in protrusion just to the right
of midline extending superiorly behind the body of L5 in extending
into the origin of the right neural foramen. Compresses the right
side of the thecal sac and posteriorly deviates the right S1 nerve
rootlets within the thecal sac. The disc is degenerated with disc
space narrowing.
IMPRESSION: 1. Prominent soft disc protrusion and extrusion at L5-S1 to the
right affecting the right L5 and S1 nerves.
2. Tiny disc protrusion to the left at L4-5 adjacent 2 but not
compressing the left L5 nerve rootlets

## 2019-10-06 ENCOUNTER — Ambulatory Visit: Payer: Self-pay | Attending: Internal Medicine

## 2019-10-06 DIAGNOSIS — Z23 Encounter for immunization: Secondary | ICD-10-CM | POA: Insufficient documentation

## 2019-10-06 NOTE — Progress Notes (Signed)
   Covid-[facility] Vaccination [facility]  Name:  Tamara Li    MRN: [id number] DOB: 07/01/1976  10/06/2019  Ms. Norkus was observed post Covid-19 immunization for 15 minutes without incident. She was provided with Vaccine Information Sheet and instruction to access the V-Safe system.   Ms. Both was instructed to call 911 with any severe reactions post vaccine: Marland Kitchen Difficulty breathing  . Swelling of face and throat  . A fast heartbeat  . A bad rash all over body  . Dizziness and weakness   Immunizations Administered    Name Date Dose VIS Date Route   Pfizer COVID-19 Vaccine 10/06/2019  3:57 PM 0.3 mL 07/12/2019 Intramuscular   Manufacturer: [employer], [employer]   Lot: [id number]   NDC: [address]-1

## 2019-10-30 ENCOUNTER — Ambulatory Visit: Payer: 59 | Attending: Internal Medicine

## 2019-10-30 DIAGNOSIS — Z23 Encounter for immunization: Secondary | ICD-10-CM

## 2019-10-30 NOTE — Progress Notes (Signed)
   Covid-19 Vaccination Clinic  Name:  HELEM REESOR    MRN: [id number] DOB: June 28, 1976  10/30/2019  Ms. Richner was observed post Covid-19 immunization for 15 minutes without incident. She was provided with Vaccine Information Sheet and instruction to access the V-Safe system.   Ms. Hinostroza was instructed to call 911 with any severe reactions post vaccine: Marland Kitchen Difficulty breathing  . Swelling of face and throat  . A fast heartbeat  . A bad rash all over body  . Dizziness and weakness   Immunizations Administered    Name Date Dose VIS Date Route   Pfizer COVID-19 Vaccine 10/30/2019 12:36 PM 0.3 mL 07/12/2019 Intramuscular   Manufacturer: [employer], [employer]   Lot: [id number]   NDC: [address]-7

## 2019-11-05 ENCOUNTER — Ambulatory Visit: Payer: Self-pay

## 2020-12-09 DIAGNOSIS — Z124 Encounter for screening for malignant neoplasm of cervix: Secondary | ICD-10-CM | POA: Diagnosis not present

## 2020-12-09 DIAGNOSIS — Z01419 Encounter for gynecological examination (general) (routine) without abnormal findings: Secondary | ICD-10-CM | POA: Diagnosis not present

## 2020-12-09 DIAGNOSIS — Z6822 Body mass index (BMI) 22.0-22.9, adult: Secondary | ICD-10-CM | POA: Diagnosis not present

## 2020-12-09 DIAGNOSIS — Z01411 Encounter for gynecological examination (general) (routine) with abnormal findings: Secondary | ICD-10-CM | POA: Diagnosis not present

## 2020-12-09 DIAGNOSIS — Z1231 Encounter for screening mammogram for malignant neoplasm of breast: Secondary | ICD-10-CM | POA: Diagnosis not present

## 2020-12-09 DIAGNOSIS — Z113 Encounter for screening for infections with a predominantly sexual mode of transmission: Secondary | ICD-10-CM | POA: Diagnosis not present

## 2020-12-10 DIAGNOSIS — M25512 Pain in left shoulder: Secondary | ICD-10-CM | POA: Diagnosis not present

## 2021-05-04 DIAGNOSIS — D225 Melanocytic nevi of trunk: Secondary | ICD-10-CM | POA: Diagnosis not present

## 2021-05-04 DIAGNOSIS — L814 Other melanin hyperpigmentation: Secondary | ICD-10-CM | POA: Diagnosis not present

## 2021-05-04 DIAGNOSIS — L820 Inflamed seborrheic keratosis: Secondary | ICD-10-CM | POA: Diagnosis not present

## 2021-05-04 DIAGNOSIS — L570 Actinic keratosis: Secondary | ICD-10-CM | POA: Diagnosis not present

## 2021-05-04 DIAGNOSIS — D2262 Melanocytic nevi of left upper limb, including shoulder: Secondary | ICD-10-CM | POA: Diagnosis not present

## 2021-05-04 DIAGNOSIS — L538 Other specified erythematous conditions: Secondary | ICD-10-CM | POA: Diagnosis not present

## 2021-05-04 DIAGNOSIS — L298 Other pruritus: Secondary | ICD-10-CM | POA: Diagnosis not present

## 2021-05-04 DIAGNOSIS — D2272 Melanocytic nevi of left lower limb, including hip: Secondary | ICD-10-CM | POA: Diagnosis not present

## 2021-05-04 DIAGNOSIS — L821 Other seborrheic keratosis: Secondary | ICD-10-CM | POA: Diagnosis not present

## 2021-06-29 DIAGNOSIS — L905 Scar conditions and fibrosis of skin: Secondary | ICD-10-CM | POA: Diagnosis not present

## 2021-06-29 DIAGNOSIS — D2262 Melanocytic nevi of left upper limb, including shoulder: Secondary | ICD-10-CM | POA: Diagnosis not present

## 2022-02-23 DIAGNOSIS — Z124 Encounter for screening for malignant neoplasm of cervix: Secondary | ICD-10-CM | POA: Diagnosis not present

## 2022-02-23 DIAGNOSIS — Z6822 Body mass index (BMI) 22.0-22.9, adult: Secondary | ICD-10-CM | POA: Diagnosis not present

## 2022-02-23 DIAGNOSIS — R87610 Atypical squamous cells of undetermined significance on cytologic smear of cervix (ASC-US): Secondary | ICD-10-CM | POA: Diagnosis not present

## 2022-02-23 DIAGNOSIS — Z113 Encounter for screening for infections with a predominantly sexual mode of transmission: Secondary | ICD-10-CM | POA: Diagnosis not present

## 2022-02-23 DIAGNOSIS — Z01419 Encounter for gynecological examination (general) (routine) without abnormal findings: Secondary | ICD-10-CM | POA: Diagnosis not present

## 2022-02-25 DIAGNOSIS — L905 Scar conditions and fibrosis of skin: Secondary | ICD-10-CM | POA: Diagnosis not present

## 2022-02-25 DIAGNOSIS — L538 Other specified erythematous conditions: Secondary | ICD-10-CM | POA: Diagnosis not present

## 2022-02-25 DIAGNOSIS — D485 Neoplasm of uncertain behavior of skin: Secondary | ICD-10-CM | POA: Diagnosis not present

## 2022-03-08 DIAGNOSIS — M170 Bilateral primary osteoarthritis of knee: Secondary | ICD-10-CM | POA: Diagnosis not present

## 2022-03-10 DIAGNOSIS — Z1211 Encounter for screening for malignant neoplasm of colon: Secondary | ICD-10-CM | POA: Diagnosis not present

## 2022-03-10 DIAGNOSIS — Z1212 Encounter for screening for malignant neoplasm of rectum: Secondary | ICD-10-CM | POA: Diagnosis not present

## 2022-03-20 LAB — COLOGUARD: COLOGUARD: NEGATIVE

## 2022-05-02 DIAGNOSIS — N6002 Solitary cyst of left breast: Secondary | ICD-10-CM | POA: Diagnosis not present

## 2022-08-05 DIAGNOSIS — D2272 Melanocytic nevi of left lower limb, including hip: Secondary | ICD-10-CM | POA: Diagnosis not present

## 2022-08-05 DIAGNOSIS — L814 Other melanin hyperpigmentation: Secondary | ICD-10-CM | POA: Diagnosis not present

## 2022-08-05 DIAGNOSIS — L821 Other seborrheic keratosis: Secondary | ICD-10-CM | POA: Diagnosis not present

## 2022-08-05 DIAGNOSIS — D225 Melanocytic nevi of trunk: Secondary | ICD-10-CM | POA: Diagnosis not present

## 2022-08-23 DIAGNOSIS — E785 Hyperlipidemia, unspecified: Secondary | ICD-10-CM | POA: Diagnosis not present

## 2022-08-23 DIAGNOSIS — M25562 Pain in left knee: Secondary | ICD-10-CM | POA: Diagnosis not present

## 2022-09-27 DIAGNOSIS — R7989 Other specified abnormal findings of blood chemistry: Secondary | ICD-10-CM | POA: Diagnosis not present

## 2022-09-27 DIAGNOSIS — E785 Hyperlipidemia, unspecified: Secondary | ICD-10-CM | POA: Diagnosis not present

## 2022-09-27 DIAGNOSIS — D649 Anemia, unspecified: Secondary | ICD-10-CM | POA: Diagnosis not present

## 2022-10-04 DIAGNOSIS — Z1331 Encounter for screening for depression: Secondary | ICD-10-CM | POA: Diagnosis not present

## 2022-10-04 DIAGNOSIS — Z1339 Encounter for screening examination for other mental health and behavioral disorders: Secondary | ICD-10-CM | POA: Diagnosis not present

## 2022-10-04 DIAGNOSIS — Z23 Encounter for immunization: Secondary | ICD-10-CM | POA: Diagnosis not present

## 2022-10-04 DIAGNOSIS — L309 Dermatitis, unspecified: Secondary | ICD-10-CM | POA: Diagnosis not present

## 2022-10-04 DIAGNOSIS — Z0000 Encounter for general adult medical examination without abnormal findings: Secondary | ICD-10-CM | POA: Diagnosis not present

## 2022-10-05 ENCOUNTER — Other Ambulatory Visit: Payer: Self-pay | Admitting: Internal Medicine

## 2022-10-05 DIAGNOSIS — E785 Hyperlipidemia, unspecified: Secondary | ICD-10-CM

## 2022-11-03 DIAGNOSIS — Z1231 Encounter for screening mammogram for malignant neoplasm of breast: Secondary | ICD-10-CM | POA: Diagnosis not present

## 2022-11-03 DIAGNOSIS — R92343 Mammographic extreme density, bilateral breasts: Secondary | ICD-10-CM | POA: Diagnosis not present

## 2022-11-10 ENCOUNTER — Ambulatory Visit
Admission: RE | Admit: 2022-11-10 | Discharge: 2022-11-10 | Disposition: A | Discharge status: Home / Self Care | Payer: No Typology Code available for payment source | Source: Ambulatory Visit | Attending: Internal Medicine | Admitting: Internal Medicine

## 2022-11-10 DIAGNOSIS — E785 Hyperlipidemia, unspecified: Secondary | ICD-10-CM

## 2022-12-27 DIAGNOSIS — M1712 Unilateral primary osteoarthritis, left knee: Secondary | ICD-10-CM | POA: Diagnosis not present

## 2023-01-20 DIAGNOSIS — G8929 Other chronic pain: Secondary | ICD-10-CM | POA: Diagnosis not present

## 2023-01-20 DIAGNOSIS — M25562 Pain in left knee: Secondary | ICD-10-CM | POA: Diagnosis not present

## 2023-01-25 DIAGNOSIS — M1712 Unilateral primary osteoarthritis, left knee: Secondary | ICD-10-CM | POA: Diagnosis not present

## 2023-02-01 DIAGNOSIS — M25562 Pain in left knee: Secondary | ICD-10-CM | POA: Diagnosis not present

## 2023-02-01 DIAGNOSIS — Z96652 Presence of left artificial knee joint: Secondary | ICD-10-CM | POA: Diagnosis not present

## 2023-02-01 DIAGNOSIS — Z09 Encounter for follow-up examination after completed treatment for conditions other than malignant neoplasm: Secondary | ICD-10-CM | POA: Diagnosis not present

## 2023-02-06 DIAGNOSIS — M25562 Pain in left knee: Secondary | ICD-10-CM | POA: Diagnosis not present

## 2023-02-08 DIAGNOSIS — M25562 Pain in left knee: Secondary | ICD-10-CM | POA: Diagnosis not present

## 2023-02-13 DIAGNOSIS — M25562 Pain in left knee: Secondary | ICD-10-CM | POA: Diagnosis not present

## 2023-02-16 DIAGNOSIS — D225 Melanocytic nevi of trunk: Secondary | ICD-10-CM | POA: Diagnosis not present

## 2023-02-16 DIAGNOSIS — M25562 Pain in left knee: Secondary | ICD-10-CM | POA: Diagnosis not present

## 2023-02-16 DIAGNOSIS — L821 Other seborrheic keratosis: Secondary | ICD-10-CM | POA: Diagnosis not present

## 2023-02-16 DIAGNOSIS — D2272 Melanocytic nevi of left lower limb, including hip: Secondary | ICD-10-CM | POA: Diagnosis not present

## 2023-02-16 DIAGNOSIS — L814 Other melanin hyperpigmentation: Secondary | ICD-10-CM | POA: Diagnosis not present

## 2023-02-20 DIAGNOSIS — M25562 Pain in left knee: Secondary | ICD-10-CM | POA: Diagnosis not present

## 2023-02-22 DIAGNOSIS — M25562 Pain in left knee: Secondary | ICD-10-CM | POA: Diagnosis not present

## 2023-02-27 DIAGNOSIS — M25562 Pain in left knee: Secondary | ICD-10-CM | POA: Diagnosis not present

## 2023-03-01 DIAGNOSIS — M25562 Pain in left knee: Secondary | ICD-10-CM | POA: Diagnosis not present

## 2023-03-09 DIAGNOSIS — Z09 Encounter for follow-up examination after completed treatment for conditions other than malignant neoplasm: Secondary | ICD-10-CM | POA: Diagnosis not present

## 2023-05-22 DIAGNOSIS — Z96652 Presence of left artificial knee joint: Secondary | ICD-10-CM | POA: Diagnosis not present

## 2023-05-29 DIAGNOSIS — Z96652 Presence of left artificial knee joint: Secondary | ICD-10-CM | POA: Diagnosis not present

## 2023-05-29 DIAGNOSIS — R29898 Other symptoms and signs involving the musculoskeletal system: Secondary | ICD-10-CM | POA: Diagnosis not present

## 2023-06-05 DIAGNOSIS — R41841 Cognitive communication deficit: Secondary | ICD-10-CM | POA: Diagnosis not present

## 2023-06-05 DIAGNOSIS — Z608 Other problems related to social environment: Secondary | ICD-10-CM | POA: Diagnosis not present

## 2023-06-05 DIAGNOSIS — F4320 Adjustment disorder, unspecified: Secondary | ICD-10-CM | POA: Diagnosis not present

## 2023-08-15 DIAGNOSIS — Z1231 Encounter for screening mammogram for malignant neoplasm of breast: Secondary | ICD-10-CM | POA: Diagnosis not present

## 2023-08-15 DIAGNOSIS — Z1331 Encounter for screening for depression: Secondary | ICD-10-CM | POA: Diagnosis not present

## 2023-08-15 DIAGNOSIS — Z01419 Encounter for gynecological examination (general) (routine) without abnormal findings: Secondary | ICD-10-CM | POA: Diagnosis not present

## 2023-10-05 DIAGNOSIS — E785 Hyperlipidemia, unspecified: Secondary | ICD-10-CM | POA: Diagnosis not present

## 2023-10-12 DIAGNOSIS — Z0000 Encounter for general adult medical examination without abnormal findings: Secondary | ICD-10-CM | POA: Diagnosis not present

## 2023-10-12 DIAGNOSIS — Z1339 Encounter for screening examination for other mental health and behavioral disorders: Secondary | ICD-10-CM | POA: Diagnosis not present

## 2023-10-12 DIAGNOSIS — Z1331 Encounter for screening for depression: Secondary | ICD-10-CM | POA: Diagnosis not present

## 2023-10-26 DIAGNOSIS — Z96652 Presence of left artificial knee joint: Secondary | ICD-10-CM | POA: Diagnosis not present

## 2023-12-18 DIAGNOSIS — Z803 Family history of malignant neoplasm of breast: Secondary | ICD-10-CM | POA: Diagnosis not present

## 2024-03-21 ENCOUNTER — Encounter (HOSPITAL_BASED_OUTPATIENT_CLINIC_OR_DEPARTMENT_OTHER): Payer: Self-pay

## 2024-04-25 DIAGNOSIS — L570 Actinic keratosis: Secondary | ICD-10-CM | POA: Diagnosis not present

## 2024-04-25 DIAGNOSIS — L821 Other seborrheic keratosis: Secondary | ICD-10-CM | POA: Diagnosis not present

## 2024-04-25 DIAGNOSIS — L988 Other specified disorders of the skin and subcutaneous tissue: Secondary | ICD-10-CM | POA: Diagnosis not present

## 2024-04-25 DIAGNOSIS — L814 Other melanin hyperpigmentation: Secondary | ICD-10-CM | POA: Diagnosis not present

## 2024-05-29 DIAGNOSIS — M25562 Pain in left knee: Secondary | ICD-10-CM | POA: Diagnosis not present

## 2024-06-19 DIAGNOSIS — M25562 Pain in left knee: Secondary | ICD-10-CM | POA: Diagnosis not present

## 2024-06-20 DIAGNOSIS — L538 Other specified erythematous conditions: Secondary | ICD-10-CM | POA: Diagnosis not present

## 2024-06-20 DIAGNOSIS — Z09 Encounter for follow-up examination after completed treatment for conditions other than malignant neoplasm: Secondary | ICD-10-CM | POA: Diagnosis not present

## 2024-06-20 DIAGNOSIS — Z872 Personal history of diseases of the skin and subcutaneous tissue: Secondary | ICD-10-CM | POA: Diagnosis not present

## 2024-06-20 DIAGNOSIS — L820 Inflamed seborrheic keratosis: Secondary | ICD-10-CM | POA: Diagnosis not present

## 2024-06-20 DIAGNOSIS — L570 Actinic keratosis: Secondary | ICD-10-CM | POA: Diagnosis not present

## 2024-07-02 DIAGNOSIS — M25562 Pain in left knee: Secondary | ICD-10-CM | POA: Diagnosis not present

## 2024-07-10 DIAGNOSIS — M25562 Pain in left knee: Secondary | ICD-10-CM | POA: Diagnosis not present

## 2024-07-22 ENCOUNTER — Other Ambulatory Visit: Payer: Self-pay | Admitting: General Surgery

## 2024-07-22 DIAGNOSIS — Z803 Family history of malignant neoplasm of breast: Secondary | ICD-10-CM

## 2024-07-24 DIAGNOSIS — M25562 Pain in left knee: Secondary | ICD-10-CM | POA: Diagnosis not present

## 2024-08-14 ENCOUNTER — Inpatient Hospital Stay
Admission: RE | Admit: 2024-08-14 | Discharge: 2024-08-14 | Disposition: A | Source: Ambulatory Visit | Attending: General Surgery | Admitting: General Surgery

## 2024-08-14 DIAGNOSIS — Z803 Family history of malignant neoplasm of breast: Secondary | ICD-10-CM

## 2024-08-14 MED ORDER — IOPAMIDOL (ISOVUE-370) INJECTION 76%
100.0000 mL | Freq: Once | INTRAVENOUS | Status: AC | PRN
  Administered 2024-08-14: 100 mL via INTRAVENOUS
